# Patient Record
Sex: Female | Born: 1985 | Hispanic: Yes | Marital: Married | Smoking: Never smoker
Health system: Southern US, Community
[De-identification: ages and names within clinical notes are randomized; demographics above are authoritative.]

---

## 2008-05-13 ENCOUNTER — Observation Stay: Payer: Self-pay | Admitting: Obstetrics and Gynecology

## 2008-05-14 ENCOUNTER — Inpatient Hospital Stay: Payer: Self-pay

## 2010-10-31 ENCOUNTER — Ambulatory Visit: Payer: Self-pay | Admitting: Advanced Practice Midwife

## 2011-03-15 ENCOUNTER — Ambulatory Visit: Payer: Self-pay | Admitting: Advanced Practice Midwife

## 2011-03-30 ENCOUNTER — Ambulatory Visit: Payer: Self-pay | Admitting: Family Medicine

## 2011-04-05 ENCOUNTER — Inpatient Hospital Stay: Payer: Self-pay | Admitting: Obstetrics and Gynecology

## 2013-03-31 ENCOUNTER — Emergency Department: Payer: Self-pay | Admitting: Emergency Medicine

## 2013-03-31 LAB — URINALYSIS, COMPLETE
Bilirubin,UR: NEGATIVE
Glucose,UR: NEGATIVE mg/dL (ref 0–75)
Ketone: NEGATIVE
Ph: 6 (ref 4.5–8.0)
Protein: NEGATIVE
RBC,UR: 41 /HPF (ref 0–5)
Specific Gravity: 1.025 (ref 1.003–1.030)
WBC UR: 3 /HPF (ref 0–5)

## 2013-03-31 LAB — CBC
HCT: 41 % (ref 35.0–47.0)
MCHC: 35.4 g/dL (ref 32.0–36.0)
RBC: 4.34 10*6/uL (ref 3.80–5.20)
RDW: 12.8 % (ref 11.5–14.5)

## 2013-06-19 ENCOUNTER — Ambulatory Visit: Payer: Self-pay | Admitting: Advanced Practice Midwife

## 2013-10-20 ENCOUNTER — Ambulatory Visit: Payer: Self-pay | Admitting: Family Medicine

## 2013-11-03 ENCOUNTER — Inpatient Hospital Stay: Payer: Self-pay | Admitting: Obstetrics and Gynecology

## 2013-11-03 LAB — CBC WITH DIFFERENTIAL/PLATELET
Basophil #: 0 10*3/uL (ref 0.0–0.1)
Basophil %: 0.4 %
HCT: 41.9 % (ref 35.0–47.0)
HGB: 14 g/dL (ref 12.0–16.0)
Lymphocyte #: 1.4 10*3/uL (ref 1.0–3.6)
MCH: 31.2 pg (ref 26.0–34.0)
MCV: 94 fL (ref 80–100)
Monocyte %: 6.4 %
Neutrophil #: 8.2 10*3/uL — ABNORMAL HIGH (ref 1.4–6.5)
Neutrophil %: 79.4 %
RBC: 4.47 10*6/uL (ref 3.80–5.20)
RDW: 14.1 % (ref 11.5–14.5)
WBC: 10.3 10*3/uL (ref 3.6–11.0)

## 2013-11-04 LAB — HEMATOCRIT: HCT: 38.7 % (ref 35.0–47.0)

## 2014-07-24 IMAGING — US US OB FOLLOW-UP - NRPT [facility]
1 series · 14 of 28 positions shown · non-contrast
Comparison: none

CLINICAL DATA: Repeat evaluation for evaluation of fetal size

EXAM:
OBSTETRIC 14+ WK ULTRASOUND FOLLOW-UP

[Series 1: us ob follow-up - nrpt [facility] · 0.26mm/px · 14 of 49 slices shown]
[im 2/49]
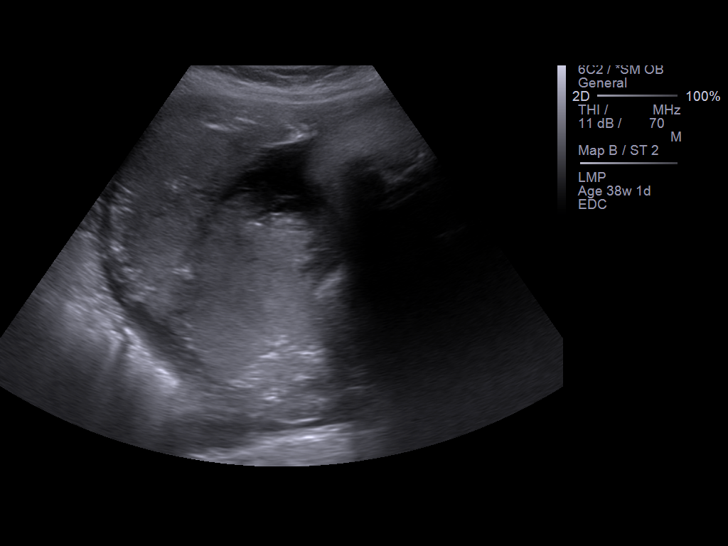
[im 6/49]
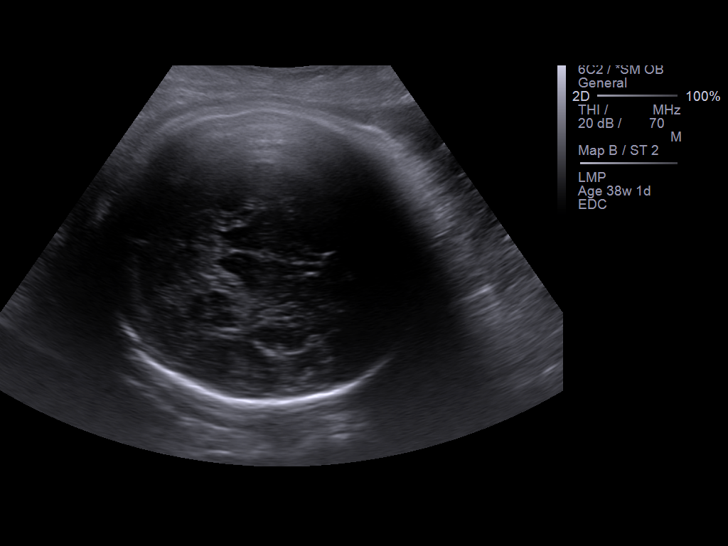
[im 9/49]
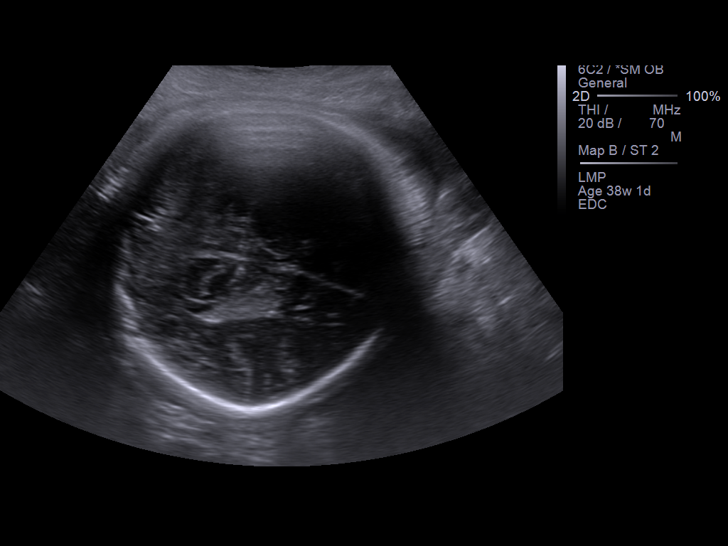
[im 13/49]
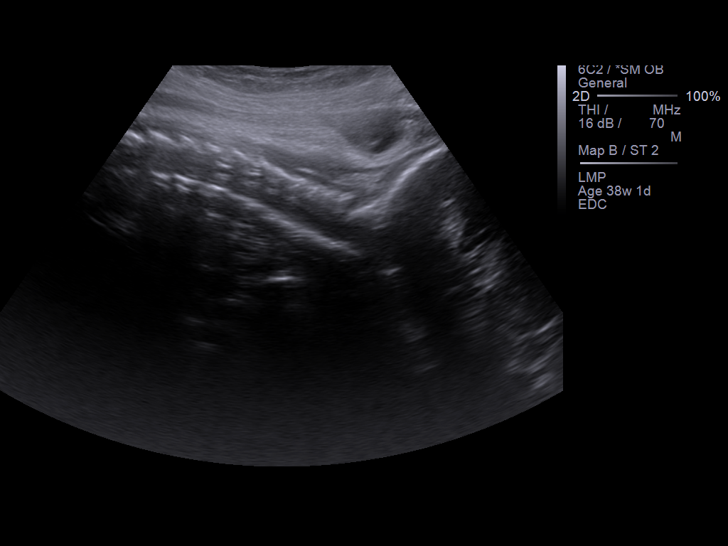
[im 17/49]
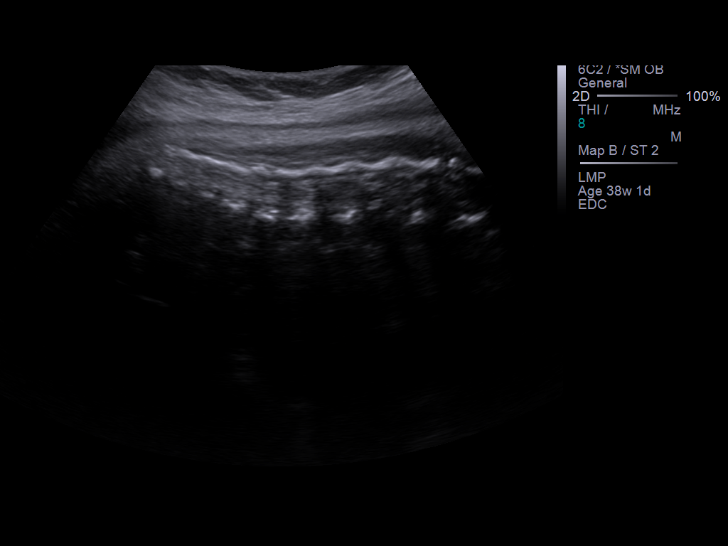
[im 20/49]
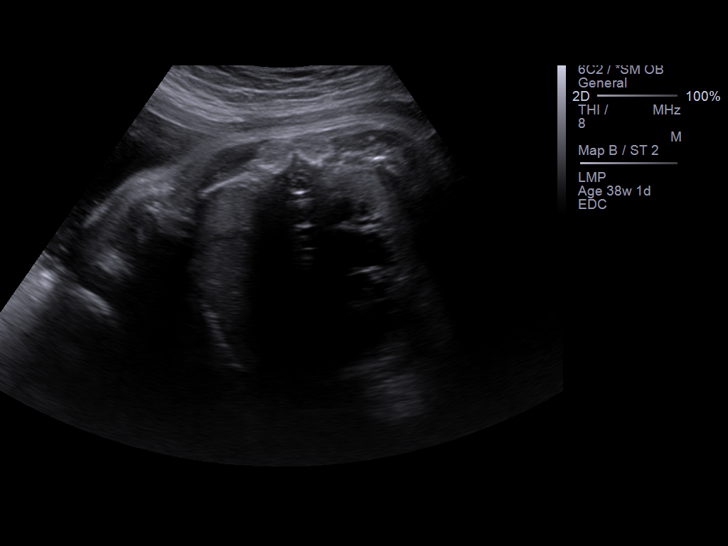
[im 24/49]
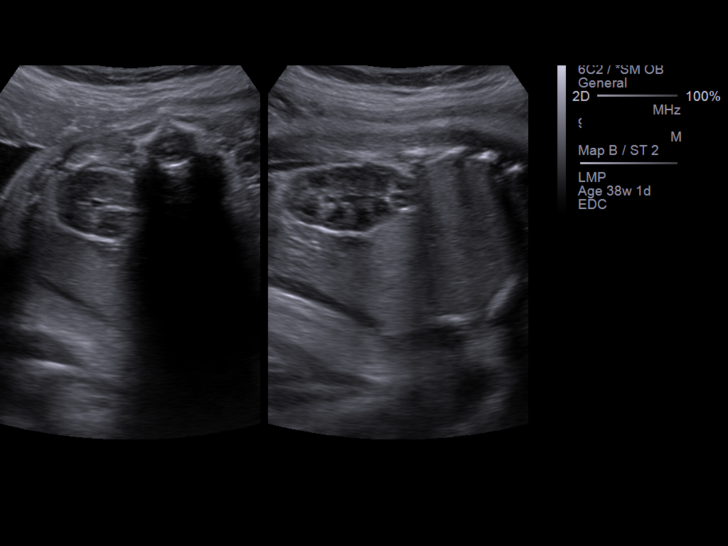
[im 27/49]
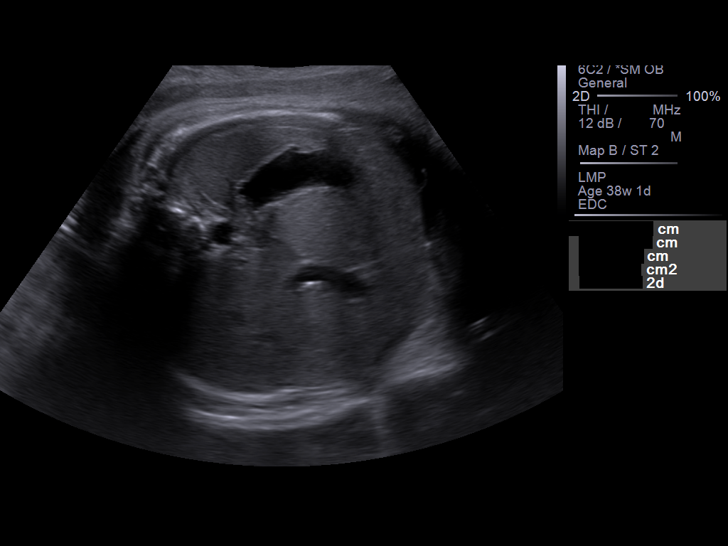
[im 31/49]
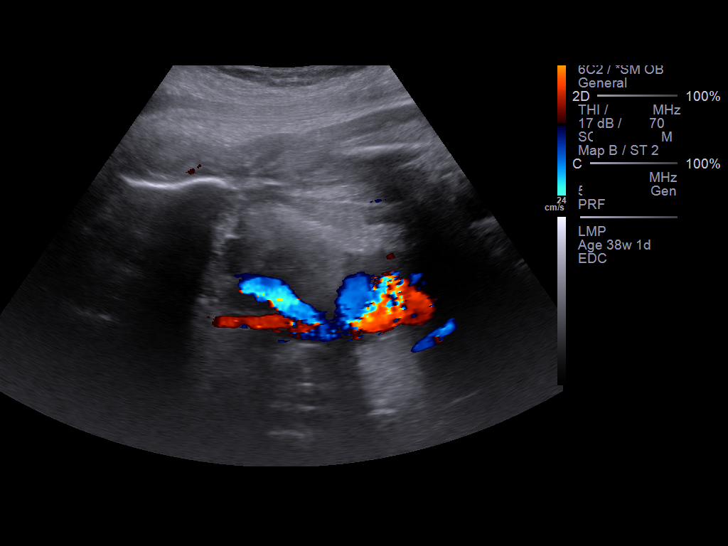
[im 34/49]
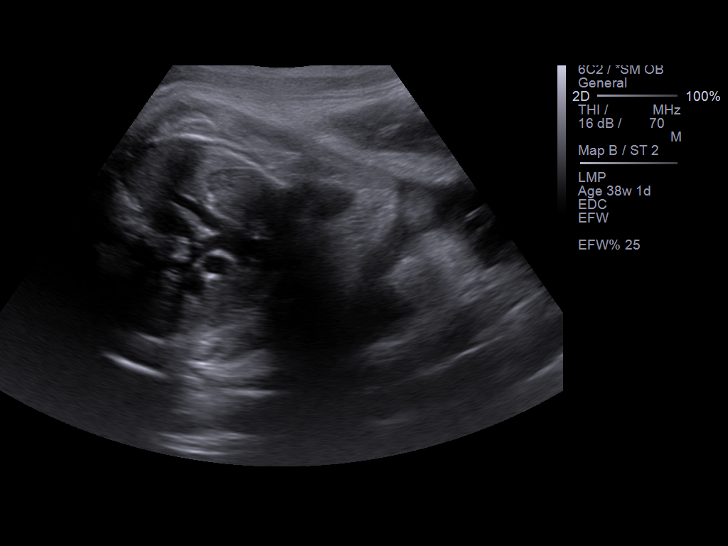
[im 38/49]
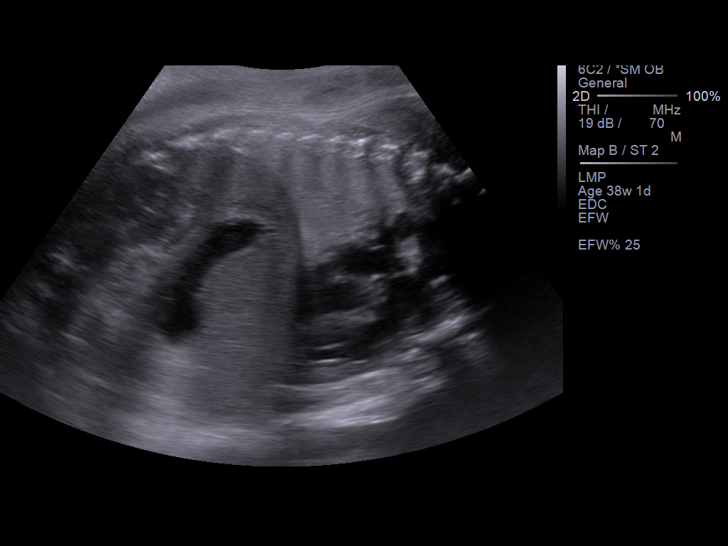
[im 41/49]
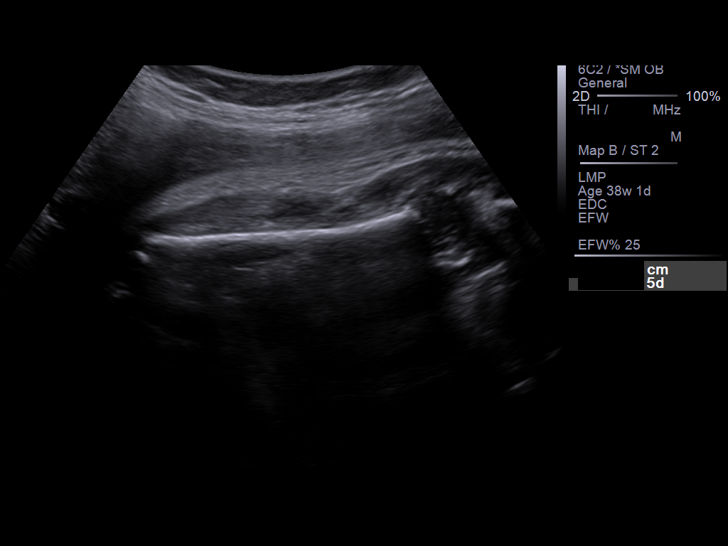
[im 45/49]
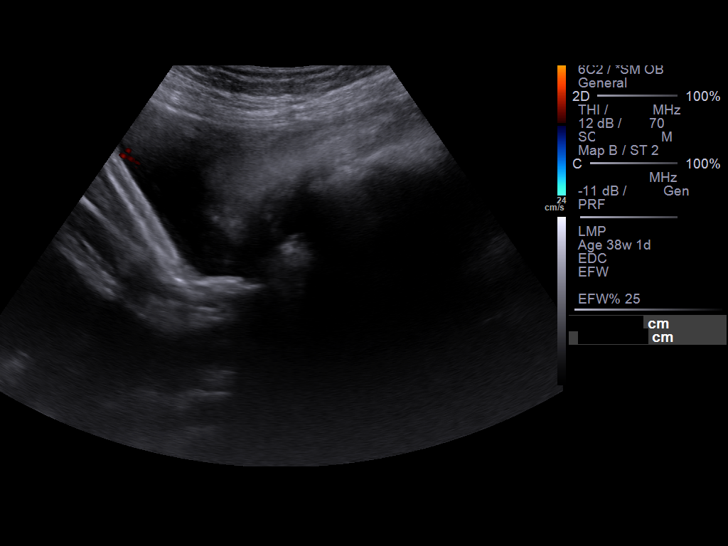
[im 49/49]
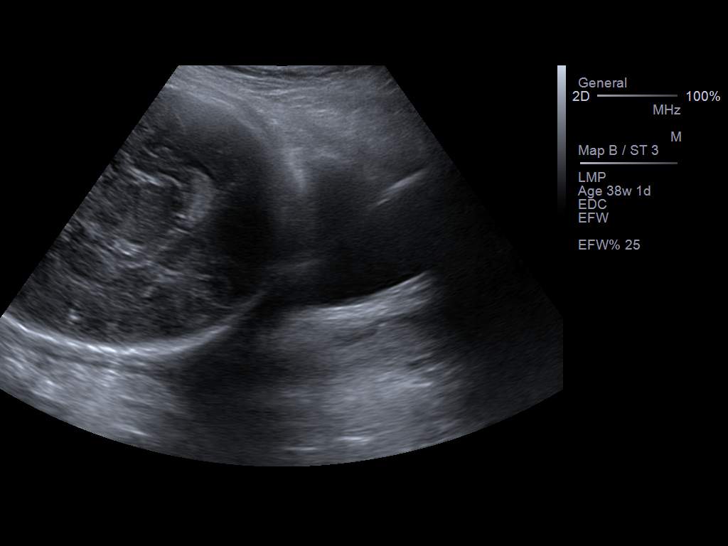

[14 of 28 positions shown; findings below may reference images not displayed]

FINDINGS: Number of Fetuses: 1

Heart Rate:  137 bpm

Movement: Appreciated.

Presentation: Cephalic.

Placental Location: Fundal

AFI 10.2 cm (within normal limits ) quadrant (1) 2.65 cm, quadrant
(2) 1.62 cm, quadrant(3) 2.53 cm, quadrant(4) 3.42 cm

FETAL BIOMETRY

BPD:  8.89cm 36w   0d

HC:    31.56cm  35w   3d

AC:   32.67cm  36w   4d

FL:   7.31cm  37w   3d

Current Mean GA:  36 W 4d                       US EDC: 11/13/2013.

Assigned GA:  38w 1d              Assigned EDC:  11/02/2013

EFW:  2,979g 25th%ile (+ / -440 1 g)

FETAL ANATOMY

There are no bladder, renal, stomach, cardiac, diaphragm, spine, nor
ventricular abnormalities appreciated.

MATERNAL FINDINGS:

Cervix not visualizedcm
IMPRESSION: Single viable intrauterine pregnancy as described above.

## 2015-03-25 NOTE — Discharge Summary (Signed)
Dates of Admission and Diagnosis:  Date of Admission 03-Nov-2013   Date of Discharge 05-Nov-2013   Admitting Diagnosis labor at term   Final Diagnosis s/p delivery    Chief Complaint/History of Present Illness labor at term   Hospital Course:  Hospital Course Did well after SVD   Condition on Discharge Good   DISCHARGE INSTRUCTIONS HOME MEDS:  Medication Reconciliation: Patient's Home Medications at Discharge:     Medication Instructions  prenata prenatal multivitamins oral tablet  1 tab(s) orally once a day   ibuprofen 600 mg oral tablet  1 tab(s) orally every 6 hours, As needed, pain, temp. greater than 100.4 or inflammation   hydrocortisone-pramoxine 1%-1% topical foam  1 application topically 3 to 4 times a day, As needed, inflammation, pruitus     Physician's Instructions:  Diet Regular   Activity Limitations no sex   Return to Work after follow up visit with MD   Time frame for Follow Up Appointment 4-6 weeks   Electronic Signatures: Margaretha GlassingEvans, Ricky L (MD)  (Signed 04-Dec-14 09:07)  Authored: ADMISSION DATE AND DIAGNOSIS, CHIEF COMPLAINT/HPI, HOSPITAL COURSE, DISCHARGE INSTRUCTIONS HOME MEDS, PATIENT INSTRUCTIONS   Last Updated: 04-Dec-14 09:07 by Margaretha GlassingEvans, Ricky L (MD)

## 2015-04-12 NOTE — H&P (Signed)
L&D Evaluation:  History:  HPI 29 [facility] HF  [facility]   Presents with contractions   Patient's Medical History No Chronic Illness   Patient's Surgical History none   Medications Pre Natal Vitamins   Allergies PCN   Social History none   Family History Non-Contributory   ROS:  ROS All systems were reviewed.  HEENT, CNS, GI, GU, Respiratory, CV, Renal and Musculoskeletal systems were found to be normal.   Exam:  Vital Signs stable   Urine Protein not completed   Abdomen gravid, tender with contractions   Estimated Fetal Weight Average for gestational age   Back no CVAT   Reflexes 1+   Pelvic 6-7   Mebranes AROM, clear   FHT normal rate with no decels   Ucx regular   Ucx Frequency 4 min   Impression:  Impression active labor   Plan:  Comments Antic SVD   Electronic Signatures: Margaretha GlassingEvans, Ricky L (MD)  (Signed 02-Dec-14 07:12)  Authored: L&D Evaluation   Last Updated: 02-Dec-14 07:12 by Margaretha GlassingEvans, Ricky L (MD)

## 2017-12-03 NOTE — L&D Delivery Note (Signed)
Delivery Note At 8:05 PM a viable and healthy female "Veronia Beets" was delivered via Vaginal, Spontaneous (Presentation:LOA, compound arm  ).  APGAR: 8, 9; weight pending skin to skin.   Placenta status: intact with trailing membranes .  Cord: 3VC  with the following complications: short  Anesthesia:  none Episiotomy: None Lacerations: None Suture Repair: n/a Est. Blood Loss (mL):  100  Mom to postpartum.  Baby to Couplet care / Skin to Skin.  32yo [id number] at 41.2wks admitted for IOL for late term pregnancy. Pitocin was started, AROM for clear fluid. She progressed to fully dilated and pushed over an intact perineum. She delivered a term infant in LOA position with a compound right arm delivered at the same time. The infant was crying vigorously on delivery. With a short cord, the baby was placed on the maternal abdomen, and delayed cord clamping for 60sec.The placenta was expressed, and trailing membranes noted. This was teased out with ring forceps. Her fundus was firm and bleeding was very minimal. No tears were noted. She tolerated the procedure well.  Christeen Douglas 09/01/2018, 8:41 PM

## 2018-02-07 ENCOUNTER — Other Ambulatory Visit: Payer: Self-pay | Admitting: Registered Nurse

## 2018-02-07 DIAGNOSIS — Z3490 Encounter for supervision of normal pregnancy, unspecified, unspecified trimester: Secondary | ICD-10-CM

## 2018-02-07 LAB — OB RESULTS CONSOLE ABO/RH: RH TYPE: POSITIVE

## 2018-02-07 LAB — OB RESULTS CONSOLE HEPATITIS B SURFACE ANTIGEN: HEP B S AG: NEGATIVE

## 2018-02-07 LAB — OB RESULTS CONSOLE RUBELLA ANTIBODY, IGM: RUBELLA: IMMUNE

## 2018-02-07 LAB — OB RESULTS CONSOLE GC/CHLAMYDIA
Chlamydia: NEGATIVE
Gonorrhea: NEGATIVE

## 2018-02-07 LAB — OB RESULTS CONSOLE ANTIBODY SCREEN: Antibody Screen: NEGATIVE

## 2018-02-07 LAB — OB RESULTS CONSOLE HIV ANTIBODY (ROUTINE TESTING): HIV: NONREACTIVE

## 2018-02-07 LAB — OB RESULTS CONSOLE VARICELLA ZOSTER ANTIBODY, IGG: Varicella: IMMUNE

## 2018-03-28 ENCOUNTER — Ambulatory Visit
Admission: RE | Admit: 2018-03-28 | Discharge: 2018-03-28 | Disposition: A | Discharge status: Home / Self Care | Payer: Self-pay | Source: Ambulatory Visit | Attending: Registered Nurse | Admitting: Registered Nurse

## 2018-03-28 ENCOUNTER — Ambulatory Visit: Payer: Self-pay

## 2018-03-28 DIAGNOSIS — Z3490 Encounter for supervision of normal pregnancy, unspecified, unspecified trimester: Secondary | ICD-10-CM

## 2018-03-28 DIAGNOSIS — Z3A17 17 weeks gestation of pregnancy: Secondary | ICD-10-CM | POA: Insufficient documentation

## 2018-03-28 DIAGNOSIS — Z3492 Encounter for supervision of normal pregnancy, unspecified, second trimester: Secondary | ICD-10-CM | POA: Insufficient documentation

## 2018-07-26 LAB — OB RESULTS CONSOLE RPR: RPR: NONREACTIVE

## 2018-07-27 LAB — OB RESULTS CONSOLE GBS: GBS: NEGATIVE

## 2018-09-01 ENCOUNTER — Other Ambulatory Visit: Payer: Self-pay | Admitting: Obstetrics and Gynecology

## 2018-09-01 ENCOUNTER — Inpatient Hospital Stay
Admission: EM | Admit: 2018-09-01 | Discharge: 2018-09-03 | DRG: 807 | Disposition: A | Discharge status: Home / Self Care | Payer: Medicaid Other | Attending: Obstetrics and Gynecology | Admitting: Obstetrics and Gynecology

## 2018-09-01 ENCOUNTER — Other Ambulatory Visit: Payer: Self-pay

## 2018-09-01 DIAGNOSIS — O326XX0 Maternal care for compound presentation, not applicable or unspecified: Secondary | ICD-10-CM | POA: Diagnosis present

## 2018-09-01 DIAGNOSIS — D649 Anemia, unspecified: Secondary | ICD-10-CM | POA: Diagnosis present

## 2018-09-01 DIAGNOSIS — O480 Post-term pregnancy: Secondary | ICD-10-CM | POA: Diagnosis present

## 2018-09-01 DIAGNOSIS — O9902 Anemia complicating childbirth: Secondary | ICD-10-CM | POA: Diagnosis present

## 2018-09-01 DIAGNOSIS — Z3A41 41 weeks gestation of pregnancy: Secondary | ICD-10-CM

## 2018-09-01 LAB — TYPE AND SCREEN
ABO/RH(D): O POS
ANTIBODY SCREEN: NEGATIVE

## 2018-09-01 LAB — CBC
HCT: 37.7 % (ref 35.0–47.0)
HEMOGLOBIN: 13.2 g/dL (ref 12.0–16.0)
MCH: 33.3 pg (ref 26.0–34.0)
MCHC: 35.1 g/dL (ref 32.0–36.0)
MCV: 94.9 fL (ref 80.0–100.0)
Platelets: 261 10*3/uL (ref 150–440)
RBC: 3.97 MIL/uL (ref 3.80–5.20)
RDW: 14.5 % (ref 11.5–14.5)
WBC: 7.9 10*3/uL (ref 3.6–11.0)

## 2018-09-01 MED ORDER — MISOPROSTOL 25 MCG QUARTER TABLET: 25.0000 ug | ORAL_TABLET | ORAL | Status: DC | PRN

## 2018-09-01 MED ORDER — ONDANSETRON HCL 4 MG/2ML IJ SOLN: 4.0000 mg | INTRAMUSCULAR | Status: DC | PRN

## 2018-09-01 MED ORDER — OXYTOCIN 40 UNITS IN LACTATED RINGERS INFUSION - SIMPLE MED
1.0000 m[IU]/min | INTRAVENOUS | Status: DC
  Administered 2018-09-01: 2 m[IU]/min via INTRAVENOUS

## 2018-09-01 MED ORDER — BENZOCAINE-MENTHOL 20-0.5 % EX AERO: 1.0000 "application " | INHALATION_SPRAY | CUTANEOUS | Status: DC | PRN

## 2018-09-01 MED ORDER — ZOLPIDEM TARTRATE 5 MG PO TABS: 5.0000 mg | ORAL_TABLET | Freq: Every evening | ORAL | Status: DC | PRN

## 2018-09-01 MED ORDER — OXYTOCIN 40 UNITS IN LACTATED RINGERS INFUSION - SIMPLE MED: 2.5000 [IU]/h | INTRAVENOUS | Status: DC

## 2018-09-01 MED ORDER — MEASLES, MUMPS & RUBELLA VAC ~~LOC~~ INJ: 0.5000 mL | INJECTION | Freq: Once | SUBCUTANEOUS | Status: DC

## 2018-09-01 MED ORDER — SODIUM CHLORIDE 0.9% FLUSH: 3.0000 mL | INTRAVENOUS | Status: DC | PRN

## 2018-09-01 MED ORDER — WITCH HAZEL-GLYCERIN EX PADS: 1.0000 "application " | MEDICATED_PAD | CUTANEOUS | Status: DC | PRN

## 2018-09-01 MED ORDER — ACETAMINOPHEN 325 MG PO TABS: 650.0000 mg | ORAL_TABLET | ORAL | Status: DC | PRN

## 2018-09-01 MED ORDER — OXYTOCIN 40 UNITS IN LACTATED RINGERS INFUSION - SIMPLE MED
INTRAVENOUS | Status: AC
  Filled 2018-09-01: qty 1000

## 2018-09-01 MED ORDER — AMMONIA AROMATIC IN INHA
RESPIRATORY_TRACT | Status: AC
  Filled 2018-09-01: qty 10

## 2018-09-01 MED ORDER — OXYTOCIN 10 UNIT/ML IJ SOLN
INTRAMUSCULAR | Status: AC
  Filled 2018-09-01: qty 2

## 2018-09-01 MED ORDER — DIBUCAINE 1 % RE OINT: 1.0000 "application " | TOPICAL_OINTMENT | RECTAL | Status: DC | PRN

## 2018-09-01 MED ORDER — LACTATED RINGERS IV SOLN: 500.0000 mL | INTRAVENOUS | Status: DC | PRN

## 2018-09-01 MED ORDER — LACTATED RINGERS IV SOLN
INTRAVENOUS | Status: DC
  Administered 2018-09-01: 18:00:00 via INTRAVENOUS

## 2018-09-01 MED ORDER — BISACODYL 10 MG RE SUPP: 10.0000 mg | Freq: Every day | RECTAL | Status: DC | PRN

## 2018-09-01 MED ORDER — COCONUT OIL OIL: 1.0000 "application " | TOPICAL_OIL | Status: DC | PRN

## 2018-09-01 MED ORDER — PRENATAL MULTIVITAMIN CH
1.0000 | ORAL_TABLET | Freq: Every day | ORAL | Status: DC
  Administered 2018-09-02: 1 via ORAL
  Filled 2018-09-01: qty 1

## 2018-09-01 MED ORDER — OXYTOCIN BOLUS FROM INFUSION
500.0000 mL | Freq: Once | INTRAVENOUS | Status: AC
  Administered 2018-09-01: 500 mL via INTRAVENOUS

## 2018-09-01 MED ORDER — TERBUTALINE SULFATE 1 MG/ML IJ SOLN: 0.2500 mg | Freq: Once | INTRAMUSCULAR | Status: DC | PRN

## 2018-09-01 MED ORDER — TETANUS-DIPHTH-ACELL PERTUSSIS 5-2.5-18.5 LF-MCG/0.5 IM SUSP: 0.5000 mL | Freq: Once | INTRAMUSCULAR | Status: DC

## 2018-09-01 MED ORDER — LIDOCAINE HCL (PF) 1 % IJ SOLN
INTRAMUSCULAR | Status: AC
  Filled 2018-09-01: qty 30

## 2018-09-01 MED ORDER — IBUPROFEN 600 MG PO TABS
600.0000 mg | ORAL_TABLET | Freq: Four times a day (QID) | ORAL | Status: DC
  Administered 2018-09-02 – 2018-09-03 (×6): 600 mg via ORAL
  Filled 2018-09-01 (×6): qty 1

## 2018-09-01 MED ORDER — ONDANSETRON HCL 4 MG PO TABS: 4.0000 mg | ORAL_TABLET | ORAL | Status: DC | PRN

## 2018-09-01 MED ORDER — OXYCODONE HCL 5 MG PO TABS: 5.0000 mg | ORAL_TABLET | ORAL | Status: DC | PRN

## 2018-09-01 MED ORDER — FLEET ENEMA 7-19 GM/118ML RE ENEM: 1.0000 | ENEMA | Freq: Every day | RECTAL | Status: DC | PRN

## 2018-09-01 MED ORDER — DIPHENHYDRAMINE HCL 25 MG PO CAPS: 25.0000 mg | ORAL_CAPSULE | Freq: Four times a day (QID) | ORAL | Status: DC | PRN

## 2018-09-01 MED ORDER — SENNOSIDES-DOCUSATE SODIUM 8.6-50 MG PO TABS
2.0000 | ORAL_TABLET | ORAL | Status: DC
  Administered 2018-09-02 – 2018-09-03 (×2): 2 via ORAL
  Filled 2018-09-01 (×2): qty 2

## 2018-09-01 MED ORDER — LIDOCAINE HCL (PF) 1 % IJ SOLN: 30.0000 mL | INTRAMUSCULAR | Status: DC | PRN

## 2018-09-01 MED ORDER — MISOPROSTOL 200 MCG PO TABS
ORAL_TABLET | ORAL | Status: AC
  Filled 2018-09-01: qty 4

## 2018-09-01 MED ORDER — ONDANSETRON HCL 4 MG/2ML IJ SOLN: 4.0000 mg | Freq: Four times a day (QID) | INTRAMUSCULAR | Status: DC | PRN

## 2018-09-01 MED ORDER — SOD CITRATE-CITRIC ACID 500-334 MG/5ML PO SOLN: 30.0000 mL | ORAL | Status: DC | PRN

## 2018-09-01 MED ORDER — SODIUM CHLORIDE 0.9 % IV SOLN: 250.0000 mL | INTRAVENOUS | Status: DC | PRN

## 2018-09-01 MED ORDER — OXYTOCIN 40 UNITS IN LACTATED RINGERS INFUSION - SIMPLE MED
2.5000 [IU]/h | INTRAVENOUS | Status: DC
  Administered 2018-09-01: 2.5 [IU]/h via INTRAVENOUS

## 2018-09-01 MED ORDER — OXYTOCIN BOLUS FROM INFUSION: 500.0000 mL | Freq: Once | INTRAVENOUS | Status: DC

## 2018-09-01 MED ORDER — SODIUM CHLORIDE 0.9% FLUSH: 3.0000 mL | Freq: Two times a day (BID) | INTRAVENOUS | Status: DC

## 2018-09-01 MED ORDER — SIMETHICONE 80 MG PO CHEW: 80.0000 mg | CHEWABLE_TABLET | ORAL | Status: DC | PRN

## 2018-09-01 NOTE — OB Triage Note (Deleted)
Sent from [facility] office for IOL for postdates

## 2018-09-01 NOTE — Progress Notes (Signed)
Orders for IOL admission at 41+2wks. Pt to be notified by clinic staff at [facility].  Unknown Bishops score, no SVE on faxed records.   Rimsha Trembley A, CNM 09/01/2018 12:58 PM

## 2018-09-01 NOTE — H&P (Signed)
OB ADMISSION/ HISTORY & PHYSICAL:  Admission Date: 09/01/2018  3:41 PM  Admit Diagnosis: IOL for late term delivery  Lynn Edwards is a 32 y.o. female.  Prenatal History: [id number]   EDC : 08/23/2018, by Last Menstrual Period  [facility]renatal care at [facility] Prenatal course uncomplicated  Medical / Surgical History :  Past medical history: History reviewed. No pertinent past medical history.   Past surgical history: History reviewed. No pertinent surgical history.  Family History: History reviewed. No pertinent family history.   Social History:  reports that she has never smoked. She has never used smokeless tobacco. She reports that she does not drink alcohol or use drugs.   Allergies: Shrimp [shellfish allergy] and Penicillins    Current Medications at time of admission:  Prior to Admission medications   Medication Sig Start Date End Date Taking? Authorizing Provider  prenatal vitamin w/FE, FA (PRENATAL 1 + 1) 27-1 MG TABS tablet Take 1 tablet by mouth daily at 12 noon.   Yes [provider]     Review of Systems: Active FM  Physical Exam:  VS: Blood pressure 127/62, pulse 67, temperature 98.1 F (36.7 C), temperature source Oral, resp. rate 19, height 4' 11.5" (1.511 m), weight 75.1 kg, last menstrual period 11/16/2017, unknown if currently breastfeeding.  General: alert and oriented, appears NAD Heart: RRR Lungs: Clear lung fields Abdomen: Gravid, soft and non-tender, non-distended / uterus: firm Extremities: no edema  FHT: 130, moderate variability, +accels, no decels TOCO: q5-6 SVE:  Dilation: 10 / Effacement (%): 100 / Station: -1    Cephalic by leopolds  Prenatal Labs: Blood type/Rh --/--/O POS (09/30 1749)  Antibody screen neg  Rubella Immune  Varicella Immune  RPR NR  HBsAg Neg  HIV NR  GC neg  Chlamydia neg  Genetic screening negative  1 hour GTT 126  3 hour GTT n/a  GBS negative   No results found.  Assessment: 41+[redacted] weeks  gestation 1 stage of labor FHR category 1   Plan:   Admit for induction of labor Labs pending Epidural when desired Continuous fetal monitoring   1. Fetal Well being  - Fetal Tracing: Cat I - Ultrasound: 17wk anatomy scan, incomplete, no further scans in our system - Group B Streptococcus: neg - Presentation: vtx confirmed by Leopolds   2. Routine OB: - Prenatal labs reviewed, as above - Rh O positive  3. Induction of Labor:  -  Contractions external toco in place -  Pelvis proven to 6lbs -  Plan for induction with pitocin  4. Post Partum Planning: - Infant feeding: breast and bottle - Contraception: Nexplanon

## 2018-09-01 NOTE — Discharge Summary (Signed)
Obstetrical Discharge Summary  Patient Name: Lynn Edwards DOB: 29-Jun-1986 MRN: [id number]  Date of Admission: 09/01/2018 Date of Discharge: 09/03/18 Primary OB: [facility]  Gestational Age at Delivery: [redacted]w[redacted]d   Antepartum complications: None Admitting Diagnosis: induction of labor for later term pregnancy Secondary Diagnosis: Patient Active Problem List   Diagnosis Date Noted  . Post-term pregnancy, 40-42 weeks of gestation 09/01/2018    Augmentation: AROM and Pitocin Complications: None Intrapartum complications/course:   32yo [id number] at 41.2wks admitted for IOL for late term pregnancy. Pitocin was started, AROM for clear fluid. She progressed to fully dilated and pushed over an intact perineum. She delivered a term infant in LOA position with a compound right arm delivered at the same time. The infant was crying vigorously on delivery. With a short cord, the baby was placed on the maternal abdomen, and delayed cord clamping for 60sec.The placenta was expressed, and trailing membranes noted. This was teased out with ring forceps. Her fundus was firm and bleeding was very minimal. No tears were noted. She tolerated the procedure well.  Date of Delivery: 09/01/18 Delivered By: Christeen Douglas Delivery Type: spontaneous vaginal delivery Anesthesia: none Placenta: Spontaneous Laceration: none Episiotomy: none Newborn Data: Live born female "Yamileth" Birth Weight:   APGAR: 8, 9  Newborn Delivery   Birth date/time:  09/01/2018 20:05:00 Delivery type:  Vaginal, Spontaneous       Discharge Physical Exam: 09/03/2018  BP 115/64 (BP Location: Right Arm)   Pulse 60   Temp 98 F (36.7 C) (Oral)   Resp 20   Ht 4' 11.5" (1.511 m)   Wt 75.1 kg   LMP 11/16/2017   SpO2 100%   Breastfeeding? Unknown   BMI 32.89 kg/m   General: NAD CV: RRR Pulm: CTABL, nl effort ABD: s/nd/nt, fundus firm and below the umbilicus Lochia: moderate DVT Evaluation: LE non-ttp, no evidence of DVT on  exam.  Hemoglobin  Date Value Ref Range Status  09/02/2018 11.4 (L) 12.0 - 16.0 g/dL Final   HGB  Date Value Ref Range Status  11/03/2013 14.0 12.0 - 16.0 g/dL Final   HCT  Date Value Ref Range Status  09/02/2018 32.2 (L) 35.0 - 47.0 % Final  11/04/2013 38.7 35.0 - 47.0 % Final    Post partum course:uncomplicated  Postpartum Procedures: none Disposition: stable, discharge to home. Baby Feeding: breastmilk & formula Baby Disposition: home with mom  Rh Immune globulin given: n/a Rubella vaccine given:  Tdap vaccine given in AP or PP setting: 06/06/18 Flu vaccine given in AP or PP setting: 12/27/17  Contraception: Nexplanon  Prenatal Labs: Blood type/Rh --/--/O POS (09/30 1749)  Antibody screen neg  Rubella Immune  Varicella Immune  RPR NR  HBsAg Neg  HIV NR  GC neg  Chlamydia neg  Genetic screening negative  1 hour GTT 126  3 hour GTT n/a  GBS negative     Plan:  Loetta Rough was discharged to home in good condition. Follow-[facility] appointment at [facility] with delivering provider in 6 weeks.   Discharge Medications: Allergies as of 09/03/2018      Reactions   Shrimp [shellfish Allergy] Hives   Penicillins Hives      Medication List    TAKE these medications   prenatal vitamin w/FE, FA 27-1 MG Tabs tablet Take 1 tablet by mouth daily at 12 noon.       Follow-up Information    Center, [facility] Follow up in 6 week(s).   Why:  pp care , wants Nexplanon at 6 week appt  Contact information: [address] [place] [address] [phone]        [facility] .   Specialty:  Skilled Nursing Facility Contact information: [address] [place] [address] [phone]          Signed: Jennell Corner MD

## 2018-09-01 NOTE — OB Triage Note (Addendum)
Patient presented to L&D with complaints of contractions since 5:30 this morning, denies leaking of fluid or decreased fetal movement. States she's had some bloody discharge. Sent from [facility] office for possible IOL for postdates.

## 2018-09-02 LAB — CBC
HCT: 32.2 % — ABNORMAL LOW (ref 35.0–47.0)
HEMOGLOBIN: 11.4 g/dL — AB (ref 12.0–16.0)
MCH: 33.4 pg (ref 26.0–34.0)
MCHC: 35.2 g/dL (ref 32.0–36.0)
MCV: 94.7 fL (ref 80.0–100.0)
PLATELETS: 218 10*3/uL (ref 150–440)
RBC: 3.4 MIL/uL — AB (ref 3.80–5.20)
RDW: 14.2 % (ref 11.5–14.5)
WBC: 10.3 10*3/uL (ref 3.6–11.0)

## 2018-09-02 LAB — RPR: RPR Ser Ql: NONREACTIVE

## 2018-09-02 NOTE — Progress Notes (Signed)
Post Partum Day 1 Subjective: Doing well, no complaints.  Tolerating regular diet, pain with PO meds, voiding and ambulating without difficulty.  No CP SOB Fever,Chills, N/V or leg pain; denies nipple or breast pain; no HA change of vision, RUQ/epigastric pain  Objective: BP 114/72 (BP Location: Right Arm)   Pulse 64   Temp 98 F (36.7 C) (Oral)   Resp 18   Ht 4' 11.5" (1.511 m)   Wt 75.1 kg   LMP 11/16/2017   SpO2 98%   Breastfeeding? Unknown   BMI 32.89 kg/m    Physical Exam:  General: NAD Breasts: soft/nontender CV: RRR Pulm: nl effort, CTABL Abdomen: soft, NT, BS x 4 Perineum: minimal edema, intact Lochia: moderate Uterine Fundus: fundus firm and 2 fb below umbilicus DVT Evaluation: no cords, ttp LEs   Recent Labs    09/01/18 1719 09/02/18 0516  HGB 13.2 11.4*  HCT 37.7 32.2*  WBC 7.9 10.3  PLT 261 218    Assessment/Plan: 32 y.o. [id number] postpartum day # 1  - Continue routine PP care - Lactation consult prn  - Discussed contraceptive options, pt plans Implant at [facility] visit.  - Mild anemia - hemodynamically stable and asymptomatic; continue daily PNV - Immunization status:  all Imms up to date    Disposition: Does not desire Dc home today.     Airelle Everding A, CNM 09/02/2018  8:59 AM

## 2018-09-03 NOTE — Progress Notes (Signed)
Pt discharged with infant.  Discharge instructions, prescriptions and follow up appointment given to and reviewed with pt via interpreter. Pt verbalized understanding. Escorted out by auxillary.
